# Patient Record
Sex: Female | Born: 1965 | Race: White | Hispanic: No | Marital: Married | Smoking: Never smoker
Health system: Southern US, Community
[De-identification: ages and names within clinical notes are randomized; demographics above are authoritative.]

## PROBLEM LIST (undated history)

## (undated) DIAGNOSIS — F32A Depression, unspecified: Secondary | ICD-10-CM

## (undated) DIAGNOSIS — E079 Disorder of thyroid, unspecified: Secondary | ICD-10-CM

## (undated) DIAGNOSIS — F329 Major depressive disorder, single episode, unspecified: Secondary | ICD-10-CM

## (undated) HISTORY — PX: CERVICAL SPINE SURGERY: SHX589

## (undated) HISTORY — PX: ABDOMINAL HYSTERECTOMY: SHX81

---

## 1999-09-27 ENCOUNTER — Other Ambulatory Visit: Admission: RE | Admit: 1999-09-27 | Discharge: 1999-09-27 | Payer: Self-pay | Admitting: Family Medicine

## 2000-01-24 ENCOUNTER — Encounter (HOSPITAL_COMMUNITY): Admission: AD | Admit: 2000-01-24 | Discharge: 2000-04-23 | Payer: Self-pay | Admitting: Obstetrics and Gynecology

## 2000-03-02 ENCOUNTER — Inpatient Hospital Stay (HOSPITAL_COMMUNITY): Admission: RE | Admit: 2000-03-02 | Discharge: 2000-03-03 | Payer: Self-pay | Admitting: Obstetrics and Gynecology

## 2000-03-02 ENCOUNTER — Encounter (INDEPENDENT_AMBULATORY_CARE_PROVIDER_SITE_OTHER): Payer: Self-pay

## 2004-11-12 ENCOUNTER — Encounter: Admission: RE | Admit: 2004-11-12 | Discharge: 2004-11-12 | Payer: Self-pay | Admitting: Family Medicine

## 2006-12-11 ENCOUNTER — Ambulatory Visit (HOSPITAL_COMMUNITY): Admission: RE | Admit: 2006-12-11 | Discharge: 2006-12-11 | Payer: Self-pay | Admitting: Family Medicine

## 2007-01-07 ENCOUNTER — Ambulatory Visit (HOSPITAL_COMMUNITY): Admission: RE | Admit: 2007-01-07 | Discharge: 2007-01-08 | Payer: Self-pay | Admitting: Neurosurgery

## 2007-02-25 ENCOUNTER — Ambulatory Visit (HOSPITAL_COMMUNITY): Admission: RE | Admit: 2007-02-25 | Discharge: 2007-02-25 | Payer: Self-pay | Admitting: Family Medicine

## 2007-03-05 ENCOUNTER — Ambulatory Visit (HOSPITAL_COMMUNITY): Admission: RE | Admit: 2007-03-05 | Discharge: 2007-03-05 | Payer: Self-pay | Admitting: Family Medicine

## 2007-03-22 ENCOUNTER — Ambulatory Visit (HOSPITAL_COMMUNITY): Admission: RE | Admit: 2007-03-22 | Discharge: 2007-03-22 | Payer: Self-pay | Admitting: Family Medicine

## 2007-08-09 ENCOUNTER — Ambulatory Visit (HOSPITAL_COMMUNITY): Admission: RE | Admit: 2007-08-09 | Discharge: 2007-08-09 | Payer: Self-pay | Admitting: Family Medicine

## 2008-01-01 IMAGING — CR DG CERVICAL SPINE 2 OR 3 VIEWS
1 series · 1 of 1 positions shown · non-contrast
Comparison: none

CLINICAL DATA: Cervical disk herniation.
 CERVICAL SPINE ? 3 VIEW:

[view not recorded]
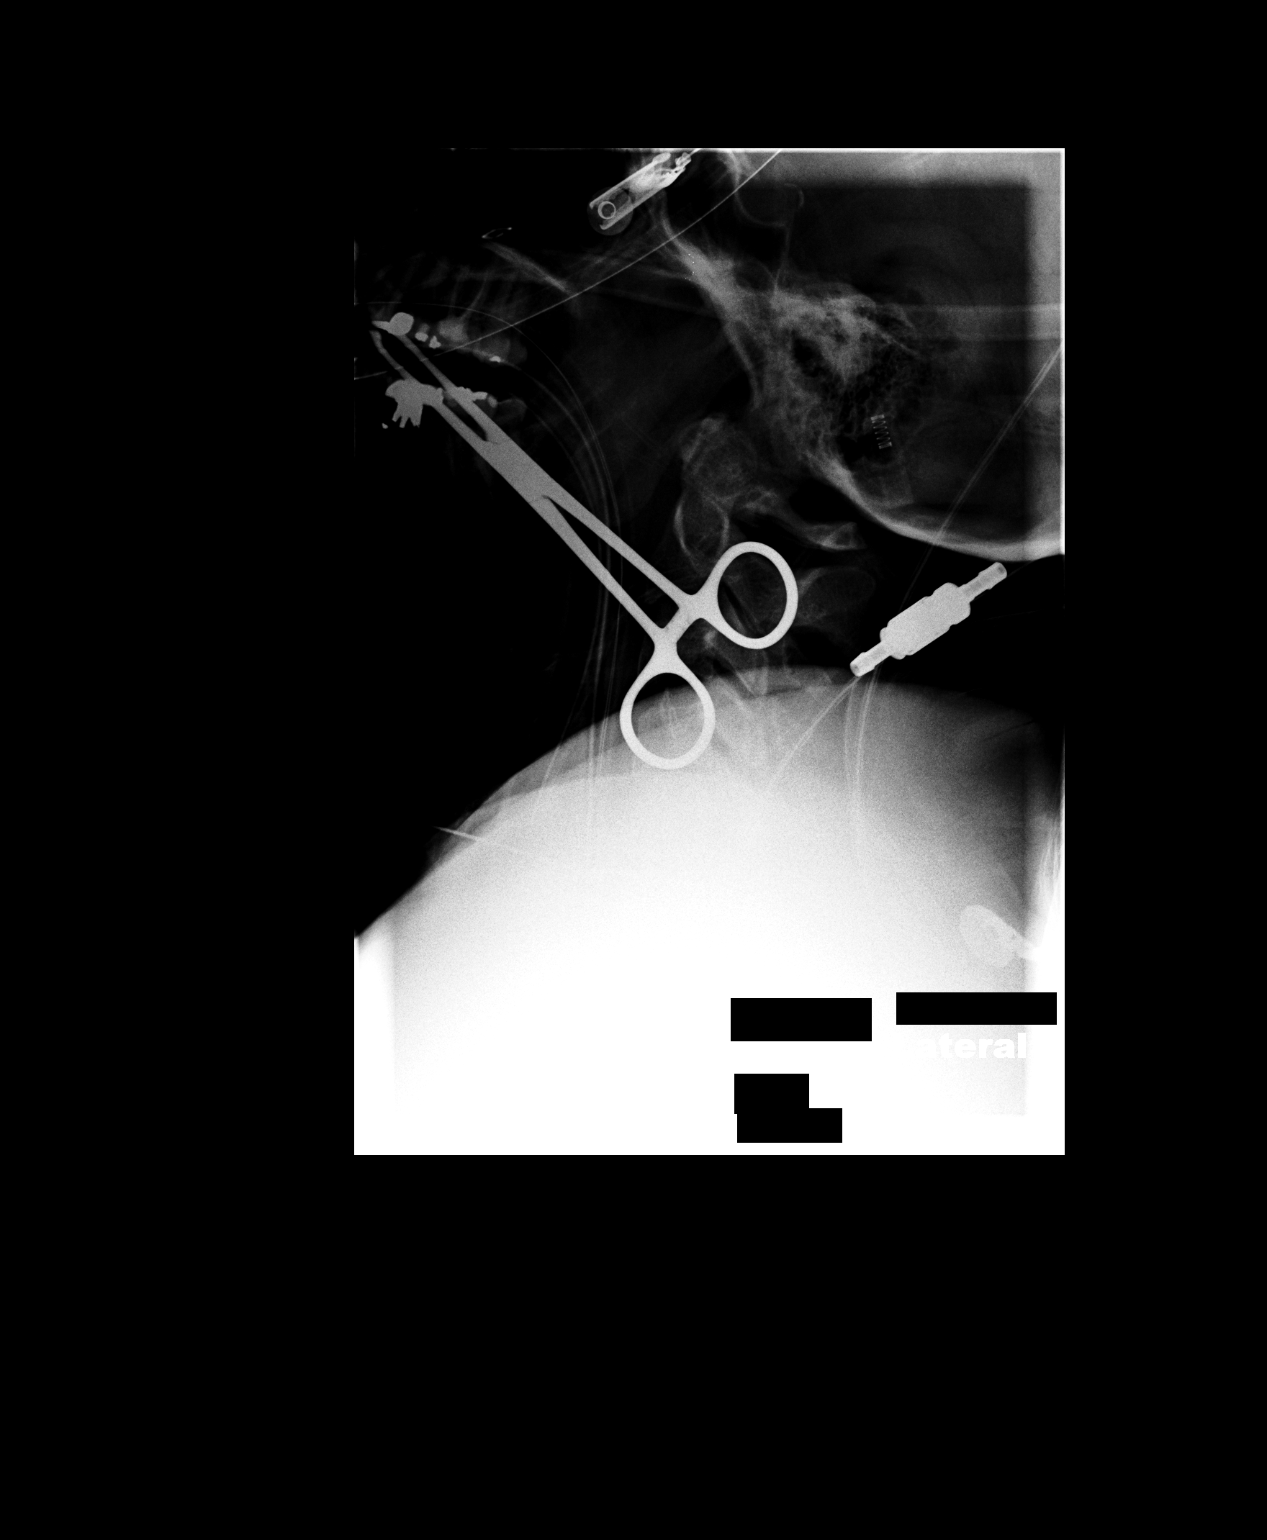

[1 of 1 positions shown; findings below may reference images not displayed]

FINDINGS: There are spinal needles marking the C4-5 and C5-6 disk space levels. There is a third film demonstrating anterior plate and screws fusing C5-6.
IMPRESSION: C5-6 fusion.

## 2008-11-29 ENCOUNTER — Other Ambulatory Visit: Admission: RE | Admit: 2008-11-29 | Discharge: 2008-11-29 | Payer: Self-pay | Admitting: Family Medicine

## 2008-11-29 ENCOUNTER — Encounter (INDEPENDENT_AMBULATORY_CARE_PROVIDER_SITE_OTHER): Payer: Self-pay | Admitting: Family Medicine

## 2011-01-24 NOTE — Discharge Summary (Signed)
[facility]  Patient:    Stefanie Carlson, Stefanie Carlson                          MRN: [id number] Adm. Date:  [id number] Disc. Date: [id number] Attending:  Silverio Lay A                           Discharge Summary  DISCHARGE DIAGNOSES: 1. Menorrhagia. 2. Secondary anemia. 3. Cystocele. 4. Abnormal Pap smear. 5. Enterocele.  HOSPITAL COURSE:  The patient underwent uncomplicated LAVH, McCullough culdoplasty and anterior colporrhaphy on March 02, 2000. Postoperative course was uncomplicated.  Hemoglobin 9.1.  Discharged to home on day #1.  Tylox given for pain.  DISCHARGE INSTRUCTIONS:  Discharge teaching done.  Follow up in the office in four to six weeks. DD:  05/14/00 TD:  05/16/00 Job: [id number]

## 2011-01-24 NOTE — H&P (Signed)
[facility] of [facility]  Patient:    Stefanie Carlson, Stefanie Carlson                          MRN: [id number] Adm. Date:  03/01/00 Attending:  Lenoard Aden, M.D. [facility]                         History and Physical  CHIEF COMPLAINT:              Carcinoma in situ of the cervix and menorrhagia.  HISTORY OF PRESENT ILLNESS:   The patient is a 45 year old white female, G2, P2, status post tubal ligation, who presents with cervical carcinoma in situ and dysfunctional uterine bleeding with menorrhagia, dysmenorrhea, and refractory anemia for definitive therapy.  GYNECOLOGIC HISTORY:          Remarkable for abnormal Pap smear and colposcopically-directed biopsy revealing aforementioned carcinoma in situ. No evidence of invasion.  She is posttubal ligation.  She has had two uncomplicated vaginal deliveries.  PAST HOSPITALIZATIONS:        Hospitalizations for mononucleosis, dehydration, and tubal ligation 1989.  FAMILY HISTORY:               Family history of diabetes and heart disease.  MEDICAL HISTORY:              Medical problems also include history of hypothyroidism.  MEDICATIONS:                  1. Synthroid.                               2. Hemocyte which is an iron supplement.  ALLERGIES:                    She has no known drug allergies.  SOCIAL HISTORY:               Noncontributory.  PHYSICAL EXAMINATION  GENERAL APPEARANCE:           She is  well-developed, well-nourished, white female in no apparent distress.  VITAL SIGNS:                  Blood pressure 118/68.  Weight of 194 pounds. Height of 5 feet, 3 inches.  HEENT:                        Normal.  LUNGS:                        Clear.  CARDIAC:                      Heart regular rate and rhythm.  ABDOMEN:                      Soft, scaphoid, and nontender.  PELVIC:                       Exam reveals a grade 2 cystocele, a retroflexed and enlarged uterus and no adnexal  masses.  IMPRESSION:                   1. Pelvic relaxation with cystocele.  2. Cervical carcinoma in situ, status post tubal                                  ligation, there for definitive therapy.                               3. Secondary anemia with previous hemoglobin of                                  6.5 now elevated to greater than 10-10.7                                  after two months of Lupron.  PLAN:                         Proceed with laparoscopically-assisted vaginal hysterectomy, anterior colporrhaphy, possible total abdominal hysterectomy. Risks from anesthesia, infection, bleeding, injury to abdominal organs, and need for repair are discussed.  Possible injury to bowel and bladder with need for repair are noted.  Delayed versus immediate complications including bowel or bladder injury are noted.  Inability to completely cure cancer is discussed with the patient as well.  She acknowledges this risks and desires to proceed. DD:  03/01/00 TD:  03/02/00 Job: [id number]

## 2011-01-24 NOTE — Op Note (Signed)
[facility] of [facility]  Patient:    Stefanie Carlson, Stefanie Carlson                          MRN: [id number] Proc. Date: 03/02/00 Adm. Date:  [id number] Attending:  Lenoard Aden                           Operative Report  PREOPERATIVE DIAGNOSES:       1. Menorrhagia with secondary anemia.                               2. Cystocele.                               3. Cervical carcinoma in situ.  POSTOPERATIVE DIAGNOSES:      1. Menorrhagia with secondary anemia.                               2. Cystocele.                               3. Cervical carcinoma in situ.  PROCEDURE:                    Laparoscopically assisted vaginal hysterectomy and anterior colporrhaphy.  McCall culdoplasty.  SURGEON:                      Lenoard Aden, M.D.  ASSISTANT:                    Sung Amabile. Roslyn Smiling, M.D.  ANESTHESIA:                   General.  ESTIMATED BLOOD LOSS:         200 cc.  DRAINS:                       Foley and vaginal pack.  COUNTS:                       Correct.  DISPOSITION:                  To recovery in good condition.  BRIEF OPERATIVE NOTE:         After being apprised of the risks of anesthesia, infection, bleeding, injury to abdominal organs and the need for repair, delayed versus immediate complications to include bowel and bladder injury and the need for repair, the patient was brought to the operating room, where she was prepped and draped in the usual sterile fashion.  She was placed under general anesthetic, a Foley catheter was placed and her feet were placed in the [place] stirrups.  After achieving adequate anesthesia and prepping the patient, examination under anesthesia revealed a grade 2 cystocele, a retroflexed boggy uterus, no adnexal masses.  Rectal exam was negative.  After this, attention was turned to the abdominal portion of the procedure, where an infraumbilical incision was made with the scalpel and the Veress needle was placed.  Then, 3 L  of CO2 were insufflated without difficulty after an openiNG pressure of -2 was noted.  Visualization revealed atraumatic  trocar entry, normal appendiceal area, and a normal liver/gallbladder area.  Two 5 mm lower quadrant ports were placed in the left and right lower quadrants atraumatically.  At this time, visualization revealed a large, retroflexed uterus extending into the cul-de-sac.  The cul-de-sac was otherwise clear. Two normal ovaries were noted.  Two interrupted tubes with Falope rings were noted at this time.  Tripolar was entered through the left port and the tuboovarian round ligament complex was cauterized down to resistance of 0 and cut.  Progressive bights were taken down to the level of the uterine vessels after identifying the ureter on the left side.  The same procedure was done on the right.  Both ovaries and tubes appeared normal.  The tubes were both surgically interrupted.  A bladder flap was developed using sharp dissection with the curved scissors.  Good hemostasis was achieved.  Attention was turned to the vaginal portion of the procedure, whereby a weighted speculum was placed and the cervical vaginal mucosa was infiltrated using a dilute Xylocaine epinephrine solution and scored using the scalpel.  Posterior cul-de-sac entry was made atraumatically.  A long weighted speculum was placed.  The uterosacral ligaments were bilaterally grasped and suture ligated.  Using curved Heaney clamps transfixed through the vaginal cuff, anterior cul-de-sac entry was made atraumatically.  A long Beaver was placed. The uterine vessels were secured bilaterally, clamped and suture ligated using Heaney clamps.  The laparoscopic portion of the procedure using cautery was connected to the vaginal portion of the procedure using Masterson clamps, and these pedicles were suture ligated and then tied.  At this time, the specimen was removed.  The posterior vaginal vault was reefed using a 0  Vicryl continuous suture and McCall culdoplasty sutures x 2 were placed using 0 Vicryl sutures.  The vagina was closed using 0 Vicryl interrupted sutures in figure-of-eight fashion.  Anterior repair was then performed, dissecting the vaginal mucosa of the pubovesical and cervical fascia, and this was sharply done, reducing the bladder in the process.  Anterior repair was accomplished by placing mattress sutures of 2-0 Vicryl suture and the defect was then trimmed and closed using 0 Vicryl pop-offs.  Good hemostasis was achieved. Packing was placed.  The uterosacral ligaments were tied in the midline and plicated in the midline.  The laparoscopic portion of the procedure was then revisited, whereby all pedicles appeared dry.  Pictures were taken. Irrigation was accomplished.  Good hemostasis was noted.  All instruments were removed under direct visualization.  The incision was closed using 0 and 4-0 Vicryl.  Dilute Marcaine solution was placed.  The patient tolerated the procedure well and was transferred to recovery in good condition. DD:  03/02/00 TD:  03/03/00 Job: [id number]

## 2011-01-24 NOTE — Op Note (Signed)
Stefanie Carlson                 ACCOUNT NO.:  [id number]   MEDICAL RECORD NO.:  [id number]          PATIENT TYPE:  AMB   LOCATION:  SDS                          FACILITY:  [facility]   PHYSICIAN:  Danae Orleans. Venetia Maxon, M.D.  DATE OF BIRTH:  1966/05/30   DATE OF PROCEDURE:  01/07/2007  DATE OF DISCHARGE:                               OPERATIVE REPORT   PREOPERATIVE DIAGNOSIS:  Herniated cervical disk with cervical  spondylosis, degenerative disease and radiculopathy, C5-6 level.   POSTOPERATIVE DIAGNOSIS:  Herniated cervical disk with cervical  spondylosis, degenerative disease and radiculopathy, C5-6 level.   PROCEDURE:  The anterior cervical decompression and fusion C5-6 with  PEEK interbody cage with morcellized bone autograft and anterior  cervical plate.   SURGEON:  Danae Orleans. Venetia Maxon, M.D.   ASSISTANT:  Georgiann Cocker, RN and Coletta Memos, M.D.   ANESTHESIA:  General endotracheal anesthesia.   ESTIMATED BLOOD LOSS:  Minimal.   COMPLICATIONS:  None.   DISPOSITION:  Recovery.   INDICATIONS:  Stefanie Carlson is a 45 year old woman with C6 radiculopathy  on the left and large cervical disk herniation at C5-6 with cervical  spondylosis at this level.  It was elected to take her to surgery for  anterior cervical decompression and fusion at this affected level.   PROCEDURE:  Stefanie Carlson was brought to the operating room.  Following  satisfactory and uncomplicated induction of general endotracheal  anesthesia, placement intravenous lines, the patient was placed in  supine position on the operating table.  Her neck was placed in slight  extension.  She was placed in 10 pounds of Network engineer.  Her anterior  neck was then prepped and draped in the usual sterile fashion.  The area  of planned incision was infiltrated with 0.25% Marcaine and 0.5%  lidocaine with 1:200,000 epinephrine.  The incision was marked at the  level of the carotid tubercle on the left side of midline from midline  to  the anterior border sternocleidomastoid muscle.  This was carried  sharply through a significant amount of subcutaneous fat through this  through the platysma layer.  Subplatysmal dissection was performed  exposing the anterior border sternocleidomastoid muscle.  Using blunt  dissection the carotid sheath was kept lateral, trachea and esophagus  kept medial exposing the anterior cervical spine.  A bent spinal needle  was placed in what was felt to the C5-6 level but because of the  patient's large body habitus it was difficult for this to be visualized  by intraoperative x-ray and subsequently additional marking needle was  placed one level up at what was felt to be C4-5 level.  This was  subsequently confirmed on intraoperative x-ray.  The longus colli  muscles were then taken down from the anterior cervical spine from C5-C6  bilaterally using electrocautery and Key elevator and self-retaining  shadow line retractors placed to facilitate exposure.  The interspace  was incised with 15 blade and disk material was removed in piecemeal  fashion.  Ventral bone spurs were removed and kept for use in later bone  grafting.  The interspace was  evacuated disk material and endplates were  stripped of residual disk material.  There was a large amount of disk  herniation on the left side of midline overlying the left C6 nerve root  and this was decompressed.  Using [facility] microscopic visualization the  endplates were stripped of residual disk material and endplates were  drilled with high-speed drill and the bone removed was saved for later  use in bone grafting.  The central spinal cord dura and both C6 nerve  roots widely decompressed.  Hemostasis was assured and after trial  sizing a 7-mm medium PEEK interbody cage was selected, packed with  morcellized bone autograft, inserted in the interspace, countersunk  appropriately.  A 14-mm __________ anterior cervical plate was then  affixed to the anterior  cervical spine using 14-mm variable angle  screws, two at C5, two in C6.  All screws had excellent purchase.  Locking mechanism engaged.  Spinal x-ray demonstrated well-positioned  interbody graft and anterior cervical plate at the correct level.  Hemostasis assured.  Soft tissues inspected and found to be in good  repair. Platysma layer was closed with 3-0 Vicryl sutures and skin edges  were approximated interrupted 3-0 Vicryl subcuticular stitch.  The wound  was dressed with Dermabond.  The patient was extubated in the operating  room and taken to the recovery room stable satisfactory condition having  the operation well.  Counts correct end of the case.      Danae Orleans. Venetia Maxon, M.D.  Electronically Signed     JDS/MEDQ  D:  01/07/2007  T:  01/07/2007  Job:  [id number]

## 2011-10-03 ENCOUNTER — Encounter (HOSPITAL_COMMUNITY): Payer: Self-pay

## 2011-10-03 ENCOUNTER — Emergency Department (HOSPITAL_COMMUNITY)
Admission: EM | Admit: 2011-10-03 | Discharge: 2011-10-03 | Disposition: A | Discharge status: Home / Self Care | Payer: 59 | Attending: Emergency Medicine | Admitting: Emergency Medicine

## 2011-10-03 DIAGNOSIS — F329 Major depressive disorder, single episode, unspecified: Secondary | ICD-10-CM

## 2011-10-03 DIAGNOSIS — F3289 Other specified depressive episodes: Secondary | ICD-10-CM | POA: Insufficient documentation

## 2011-10-03 DIAGNOSIS — F419 Anxiety disorder, unspecified: Secondary | ICD-10-CM

## 2011-10-03 DIAGNOSIS — F32A Depression, unspecified: Secondary | ICD-10-CM

## 2011-10-03 DIAGNOSIS — F411 Generalized anxiety disorder: Secondary | ICD-10-CM | POA: Insufficient documentation

## 2011-10-03 DIAGNOSIS — E079 Disorder of thyroid, unspecified: Secondary | ICD-10-CM | POA: Insufficient documentation

## 2011-10-03 DIAGNOSIS — Z79899 Other long term (current) drug therapy: Secondary | ICD-10-CM | POA: Insufficient documentation

## 2011-10-03 HISTORY — DX: Depression, unspecified: F32.A

## 2011-10-03 HISTORY — DX: Major depressive disorder, single episode, unspecified: F32.9

## 2011-10-03 HISTORY — DX: Disorder of thyroid, unspecified: E07.9

## 2011-10-03 MED ORDER — ESCITALOPRAM OXALATE 20 MG PO TABS: 20.0000 mg | ORAL_TABLET | Freq: Every day | ORAL | Status: DC
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
## 2011-10-03 NOTE — ED Notes (Signed)
Family at bedside. 
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
## 2011-10-03 NOTE — ED Notes (Signed)
Patient c/o anxiety and is very tearful, states she is out of her Lexapro for several days and is starting to feel the effects. Patient denies suicidal or homicidal ideation.

## 2015-01-29 ENCOUNTER — Telehealth (HOSPITAL_COMMUNITY): Payer: Self-pay | Admitting: *Deleted

## 2015-01-29 NOTE — Telephone Encounter (Signed)
Opened in Error.

## 2016-03-13 ENCOUNTER — Other Ambulatory Visit: Payer: Self-pay | Admitting: Endocrinology

## 2016-03-13 DIAGNOSIS — E010 Iodine-deficiency related diffuse (endemic) goiter: Secondary | ICD-10-CM

## 2016-06-04 ENCOUNTER — Other Ambulatory Visit: Payer: Self-pay

## 2016-06-11 ENCOUNTER — Other Ambulatory Visit: Payer: Self-pay

## 2016-06-20 ENCOUNTER — Other Ambulatory Visit: Payer: Self-pay

## 2017-04-23 DIAGNOSIS — E039 Hypothyroidism, unspecified: Secondary | ICD-10-CM | POA: Diagnosis not present

## 2017-04-23 DIAGNOSIS — E7800 Pure hypercholesterolemia, unspecified: Secondary | ICD-10-CM | POA: Diagnosis not present

## 2017-04-23 DIAGNOSIS — E1165 Type 2 diabetes mellitus with hyperglycemia: Secondary | ICD-10-CM | POA: Diagnosis not present

## 2017-04-30 DIAGNOSIS — E1165 Type 2 diabetes mellitus with hyperglycemia: Secondary | ICD-10-CM | POA: Diagnosis not present

## 2017-04-30 DIAGNOSIS — I10 Essential (primary) hypertension: Secondary | ICD-10-CM | POA: Diagnosis not present

## 2017-04-30 DIAGNOSIS — E7800 Pure hypercholesterolemia, unspecified: Secondary | ICD-10-CM | POA: Diagnosis not present

## 2017-05-01 ENCOUNTER — Other Ambulatory Visit: Payer: Self-pay | Admitting: Endocrinology

## 2017-05-01 DIAGNOSIS — E049 Nontoxic goiter, unspecified: Secondary | ICD-10-CM

## 2017-07-09 DIAGNOSIS — Z1231 Encounter for screening mammogram for malignant neoplasm of breast: Secondary | ICD-10-CM | POA: Diagnosis not present

## 2017-09-17 DIAGNOSIS — E7800 Pure hypercholesterolemia, unspecified: Secondary | ICD-10-CM | POA: Diagnosis not present

## 2017-09-17 DIAGNOSIS — E1165 Type 2 diabetes mellitus with hyperglycemia: Secondary | ICD-10-CM | POA: Diagnosis not present

## 2017-09-17 DIAGNOSIS — E039 Hypothyroidism, unspecified: Secondary | ICD-10-CM | POA: Diagnosis not present

## 2017-10-05 DIAGNOSIS — E1165 Type 2 diabetes mellitus with hyperglycemia: Secondary | ICD-10-CM | POA: Diagnosis not present

## 2017-10-05 DIAGNOSIS — E7800 Pure hypercholesterolemia, unspecified: Secondary | ICD-10-CM | POA: Diagnosis not present

## 2017-10-05 DIAGNOSIS — I10 Essential (primary) hypertension: Secondary | ICD-10-CM | POA: Diagnosis not present

## 2018-01-19 DIAGNOSIS — I10 Essential (primary) hypertension: Secondary | ICD-10-CM | POA: Diagnosis not present

## 2018-01-21 DIAGNOSIS — E118 Type 2 diabetes mellitus with unspecified complications: Secondary | ICD-10-CM | POA: Diagnosis not present

## 2018-02-04 DIAGNOSIS — E1165 Type 2 diabetes mellitus with hyperglycemia: Secondary | ICD-10-CM | POA: Diagnosis not present

## 2018-02-04 DIAGNOSIS — E039 Hypothyroidism, unspecified: Secondary | ICD-10-CM | POA: Diagnosis not present

## 2018-02-04 DIAGNOSIS — E7800 Pure hypercholesterolemia, unspecified: Secondary | ICD-10-CM | POA: Diagnosis not present

## 2018-02-09 ENCOUNTER — Encounter: Payer: Self-pay | Admitting: Gastroenterology

## 2018-03-08 ENCOUNTER — Ambulatory Visit (INDEPENDENT_AMBULATORY_CARE_PROVIDER_SITE_OTHER): Payer: Self-pay

## 2018-03-08 DIAGNOSIS — Z1211 Encounter for screening for malignant neoplasm of colon: Secondary | ICD-10-CM

## 2018-03-08 MED ORDER — NA SULFATE-K SULFATE-MG SULF 17.5-3.13-1.6 GM/177ML PO SOLN: 1.0000 | ORAL | 0 refills | Status: DC

## 2018-03-08 NOTE — Progress Notes (Signed)
Gastroenterology Pre-Procedure Review  Request Date:03/08/18 Requesting Physician: Rowan Blase PA Larene Pickett ( no previous tcs)  PATIENT REVIEW QUESTIONS: The patient responded to the following health history questions as indicated:    1. Diabetes Melitis: no 2. Joint replacements in the past 12 months: no 3. Major health problems in the past 3 months: no 4. Has an artificial valve or MVP: no 5. Has a defibrillator: no 6. Has been advised in past to take antibiotics in advance of a procedure like teeth cleaning: no 7. Family history of colon cancer: no  8. Alcohol Use: no 9. History of sleep apnea: no  10. History of coronary artery or other vascular stents placed within the last 12 months: no 11. History of any prior anesthesia complications: no    MEDICATIONS & ALLERGIES:    Patient reports the following regarding taking any blood thinners:   Plavix? no Aspirin? no Coumadin? no Brilinta? no Xarelto? no Eliquis? no Pradaxa? no Savaysa? no Effient? no  Patient confirms/reports the following medications:  Current Outpatient Medications  Medication Sig Dispense Refill  . escitalopram (LEXAPRO) 20 MG tablet Take 20 mg by mouth daily.    Marland Kitchen levothyroxine (SYNTHROID, LEVOTHROID) 112 MCG tablet Take 112 mcg by mouth daily.    Marland Kitchen lisinopril-hydrochlorothiazide (PRINZIDE,ZESTORETIC) 20-25 MG tablet daily.    . rosuvastatin (CRESTOR) 10 MG tablet daily.    Marland Kitchen escitalopram (LEXAPRO) 20 MG tablet Take 1 tablet (20 mg total) by mouth daily. 10 tablet 0   No current facility-administered medications for this visit.     Patient confirms/reports the following allergies:  No Known Allergies  No orders of the defined types were placed in this encounter.   AUTHORIZATION INFORMATION Primary Insurance: [facility],  [place] #: [id number] Pre-Cert / Josem Kaufmann required: yes Pre-Cert / Auth #: [id number]   SCHEDULE INFORMATION: Procedure has been scheduled as follows:  Date:04/29/18 , Time:12:15  Location: [facility]  Dr.Fields  This Gastroenterology Pre-Precedure Review Form is being routed to the following provider(s): LSL

## 2018-03-08 NOTE — Patient Instructions (Addendum)
Stefanie Carlson  01-15-1966 MRN: [id number]     Procedure Date: 04/09/18 Time to register: 11:15am Place to register: [facility] Procedure Time: 12:15am Scheduled provider: Barney Drain, MD    PREPARATION FOR COLONOSCOPY WITH SUPREP BOWEL PREP KIT  Note: Suprep Bowel Prep Kit is a split-dose (2day) regimen. Consumption of BOTH 6-ounce bottles is required for a complete prep.  Please notify us immediately if you are diabetic, take iron supplements, or if you are on Coumadin or any other blood thinners.                                                                                                                                                   1 DAY BEFORE PROCEDURE:  DATE: 04/08/18  DAY: Thursday  clear liquids the entire day - NO SOLID FOOD.     At 6:00pm: Complete steps 1 through 4 below, using ONE (1) 6-ounce bottle, before going to bed. Step 1:  Pour ONE (1) 6-ounce bottle of SUPREP liquid into the mixing container.  Step 2:  Add cool drinking water to the 16 ounce line on the container and mix.  Note: Dilute the solution concentrate as directed prior to use. Step 3:  DRINK ALL the liquid in the container. Step 4:  You MUST drink an additional two (2) or more 16 ounce containers of water over the next one (1) hour.   Continue clear liquids only EXCEPTION: If you take medications for your heart, blood pressure, or breathing, you may take these medications with a small amount of clear liquid.   DAY OF PROCEDURE:   DATE: 04/28/18 DAY: Friday    5 hours before your procedure at :7:15am Step 1:  Pour ONE (1) 6-ounce bottle of SUPREP liquid into the mixing container.  Step 2:  Add cool drinking water to the 16 ounce line on the container and mix.  Note: Dilute the solution concentrate as directed prior to use. Step 3:  DRINK ALL the liquid in the container. Step 4:  You MUST drink an additional two (2) or more 16 ounce containers of water over the next one (1) hour.  You MUST complete the final glass of water at least 3 hours before your colonoscopy.   Nothing by mouth past 9:15am  You may take your morning medications with sip of water unless we have instructed otherwise.    Please see below for Dietary Information.  CLEAR LIQUIDS INCLUDE:  Water Jello (NOT red in color)   Ice Popsicles (NOT red in color)   Tea (sugar ok, no milk/cream) Powdered fruit flavored drinks  Coffee (sugar ok, no milk/cream) Gatorade/ Lemonade/ Kool-Aid  (NOT red in color)   Juice: apple, white grape, white cranberry Soft drinks  Clear bullion, consomme, broth (fat free beef/chicken/vegetable)  Carbonated beverages (any kind)  Strained chicken noodle  soup Hard Candy   Remember: Clear liquids are liquids that will allow you to see your fingers on the other side of a clear glass. Be sure liquids are NOT red in color, and not cloudy, but CLEAR.  DO NOT EAT OR DRINK ANY OF THE FOLLOWING:  Dairy products of any kind   Cranberry juice Tomato juice / V8 juice   Grapefruit juice Orange juice     Red grape juice  Do not eat any solid foods, including such foods as: cereal, oatmeal, yogurt, fruits, vegetables, creamed soups, eggs, bread, crackers, pureed foods in a blender, etc.   HELPFUL HINTS FOR DRINKING PREP SOLUTION:   Make sure prep is extremely cold. Mix and refrigerate the the morning of the prep. You may also put in the freezer.   You may try mixing some Crystal Light or Country Time Lemonade if you prefer. Mix in small amounts; add more if necessary.  Try drinking through a straw  Rinse mouth with water or a mouthwash between glasses, to remove after-taste.  Try sipping on a cold beverage /ice/ popsicles between glasses of prep.  Place a piece of sugar-free hard candy in mouth between glasses.  If you become nauseated, try consuming smaller amounts, or stretch out the time between glasses. Stop for 30-60 minutes, then slowly start back drinking.     OTHER  INSTRUCTIONS  You will need a responsible adult at least 54 years of age to accompany you and drive you home. This person must remain in the waiting room during your procedure. The hospital will cancel your procedure if you do not have a responsible adult with you.   1. Wear loose fitting clothing that is easily removed. 2. Leave jewelry and other valuables at home.  3. Remove all body piercing jewelry and leave at home. 4. Total time from sign-in until discharge is approximately 2-3 hours. 5. You should go home directly after your procedure and rest. You can resume normal activities the day after your procedure. 6. The day of your procedure you should not:  Drive  Make legal decisions  Operate machinery  Drink alcohol  Return to work   You may call the office (Dept: [phone]) before 5:00pm, or page the doctor on call [phone]) after 5:00pm, for further instructions, if necessary.   Insurance Information YOU WILL NEED TO CHECK WITH YOUR INSURANCE COMPANY FOR THE BENEFITS OF COVERAGE YOU HAVE FOR THIS PROCEDURE.  UNFORTUNATELY, NOT ALL INSURANCE COMPANIES HAVE BENEFITS TO COVER ALL OR PART OF THESE TYPES OF PROCEDURES.  IT IS YOUR RESPONSIBILITY TO CHECK YOUR BENEFITS, HOWEVER, WE WILL BE GLAD TO ASSIST YOU WITH ANY CODES YOUR INSURANCE COMPANY MAY NEED.    PLEASE NOTE THAT MOST INSURANCE COMPANIES WILL NOT COVER A SCREENING COLONOSCOPY FOR PEOPLE UNDER THE AGE OF 50  IF YOU HAVE [employer] INSURANCE, YOU MAY HAVE BENEFITS FOR A SCREENING COLONOSCOPY BUT IF POLYPS ARE FOUND THE DIAGNOSIS WILL CHANGE AND THEN YOU MAY HAVE A DEDUCTIBLE THAT WILL NEED TO BE MET. SO PLEASE MAKE SURE YOU CHECK YOUR BENEFITS FOR A SCREENING COLONOSCOPY AS WELL AS A DIAGNOSTIC COLONOSCOPY.

## 2018-03-09 ENCOUNTER — Telehealth: Payer: Self-pay | Admitting: Gastroenterology

## 2018-03-09 NOTE — Telephone Encounter (Signed)
I checked the schedule and I did put the wrong day of the patients instructions and in the orders. I called and apologized to the pt. I have corrected it on her instructions and mailed it to her and I called Hoyle Sauer and the date has been changed on her orders.

## 2018-03-09 NOTE — Telephone Encounter (Signed)
Pt said she was seen by JL yesterday and said she wanted her TCS for 8/2 not 8/22. I told her it was scheduled for 8/22, but she wants to change it. Please call her at [phone]

## 2018-03-10 NOTE — Progress Notes (Signed)
Ok to schedule.

## 2018-03-19 NOTE — Telephone Encounter (Signed)
Called [employer] and changed date on PA for tcs. Ref# [id number].

## 2018-04-08 DIAGNOSIS — I10 Essential (primary) hypertension: Secondary | ICD-10-CM | POA: Diagnosis not present

## 2018-04-08 DIAGNOSIS — E1165 Type 2 diabetes mellitus with hyperglycemia: Secondary | ICD-10-CM | POA: Diagnosis not present

## 2018-04-08 DIAGNOSIS — E7800 Pure hypercholesterolemia, unspecified: Secondary | ICD-10-CM | POA: Diagnosis not present

## 2018-04-09 ENCOUNTER — Ambulatory Visit (HOSPITAL_COMMUNITY)
Admission: RE | Admit: 2018-04-09 | Discharge: 2018-04-09 | Disposition: A | Discharge status: Home / Self Care | Payer: 59 | Source: Ambulatory Visit | Attending: Gastroenterology | Admitting: Gastroenterology

## 2018-04-09 ENCOUNTER — Encounter (HOSPITAL_COMMUNITY)
Admission: RE | Disposition: A | Discharge status: Home / Self Care | Payer: Self-pay | Source: Ambulatory Visit | Attending: Gastroenterology

## 2018-04-09 ENCOUNTER — Other Ambulatory Visit: Payer: Self-pay

## 2018-04-09 ENCOUNTER — Encounter (HOSPITAL_COMMUNITY): Payer: Self-pay

## 2018-04-09 DIAGNOSIS — K621 Rectal polyp: Secondary | ICD-10-CM | POA: Insufficient documentation

## 2018-04-09 DIAGNOSIS — D124 Benign neoplasm of descending colon: Secondary | ICD-10-CM | POA: Diagnosis not present

## 2018-04-09 DIAGNOSIS — Q438 Other specified congenital malformations of intestine: Secondary | ICD-10-CM | POA: Insufficient documentation

## 2018-04-09 DIAGNOSIS — Z800 Family history of malignant neoplasm of digestive organs: Secondary | ICD-10-CM | POA: Diagnosis not present

## 2018-04-09 DIAGNOSIS — K648 Other hemorrhoids: Secondary | ICD-10-CM | POA: Diagnosis not present

## 2018-04-09 DIAGNOSIS — Z8371 Family history of colonic polyps: Secondary | ICD-10-CM | POA: Diagnosis not present

## 2018-04-09 DIAGNOSIS — F329 Major depressive disorder, single episode, unspecified: Secondary | ICD-10-CM | POA: Insufficient documentation

## 2018-04-09 DIAGNOSIS — K635 Polyp of colon: Secondary | ICD-10-CM | POA: Insufficient documentation

## 2018-04-09 DIAGNOSIS — E079 Disorder of thyroid, unspecified: Secondary | ICD-10-CM | POA: Diagnosis not present

## 2018-04-09 DIAGNOSIS — Z79899 Other long term (current) drug therapy: Secondary | ICD-10-CM | POA: Diagnosis not present

## 2018-04-09 DIAGNOSIS — Z1211 Encounter for screening for malignant neoplasm of colon: Secondary | ICD-10-CM | POA: Diagnosis not present

## 2018-04-09 HISTORY — PX: POLYPECTOMY: SHX5525

## 2018-04-09 HISTORY — PX: COLONOSCOPY: SHX5424

## 2018-04-09 LAB — GLUCOSE, CAPILLARY: GLUCOSE-CAPILLARY: 206 mg/dL — AB (ref 70–99)

## 2018-04-09 SURGERY — COLONOSCOPY
Anesthesia: Moderate Sedation

## 2018-04-09 MED ORDER — STERILE WATER FOR IRRIGATION IR SOLN
Status: DC | PRN
  Administered 2018-04-09: 1.5 mL

## 2018-04-09 MED ORDER — MIDAZOLAM HCL 5 MG/5ML IJ SOLN
INTRAMUSCULAR | Status: AC
  Filled 2018-04-09: qty 10

## 2018-04-09 MED ORDER — MEPERIDINE HCL 100 MG/ML IJ SOLN
INTRAMUSCULAR | Status: AC
  Filled 2018-04-09: qty 2

## 2018-04-09 MED ORDER — SODIUM CHLORIDE 0.9 % IV SOLN
INTRAVENOUS | Status: DC
  Administered 2018-04-09: 11:00:00 via INTRAVENOUS

## 2018-04-09 MED ORDER — MEPERIDINE HCL 100 MG/ML IJ SOLN
INTRAMUSCULAR | Status: DC | PRN
  Administered 2018-04-09 (×2): 25 mg via INTRAVENOUS

## 2018-04-09 MED ORDER — MIDAZOLAM HCL 5 MG/5ML IJ SOLN
INTRAMUSCULAR | Status: DC | PRN
  Administered 2018-04-09 (×2): 2 mg via INTRAVENOUS

## 2018-04-09 NOTE — Op Note (Signed)
[facility] Patient Name: Stefanie Carlson Procedure Date: 04/09/2018 10:33 AM MRN: [id number] Date of Birth: [birth year] Attending MD: Barney Drain MD, MD CSN: [id number] Age: 52 Admit Type: Outpatient Procedure:                Colonoscopy WITH COLD SNARE POLYPECTOMY Indications:              Screening for colorectal malignant neoplasm Providers:                Barney Drain MD, MD, Charlsie Quest. Theda Sers RN, RN,                            Aram Candela Referring MD:             Collene Mares PA, PA Medicines:                Meperidine 50 mg IV, Midazolam 4 mg IV Complications:            No immediate complications. Estimated Blood Loss:     Estimated blood loss was minimal. Procedure:                Pre-Anesthesia Assessment:                           - Prior to the procedure, a History and Physical                            was performed, and patient medications and                            allergies were reviewed. The patient's tolerance of                            previous anesthesia was also reviewed. The risks                            and benefits of the procedure and the sedation                            options and risks were discussed with the patient.                            All questions were answered, and informed consent                            was obtained. Prior Anticoagulants: The patient has                            taken no previous anticoagulant or antiplatelet                            agents. ASA Grade Assessment: II - A patient with                            mild systemic disease. After reviewing the risks  and benefits, the patient was deemed in                            satisfactory condition to undergo the procedure.                            After obtaining informed consent, the colonoscope                            was passed under direct vision. Throughout the                            procedure, the patient's blood  pressure, pulse, and                            oxygen saturations were monitored continuously. The                            CF-HQ190L ([id number]) scope was introduced through                            the anus and advanced to the the cecum, identified                            by appendiceal orifice and ileocecal valve. The                            colonoscopy was somewhat difficult due to a                            tortuous colon. Successful completion of the                            procedure was aided by straightening and shortening                            the scope to obtain bowel loop reduction and                            COLOWRAP. The patient tolerated the procedure well.                            The quality of the bowel preparation was excellent.                            The ileocecal valve, appendiceal orifice, and                            rectum were photographed. Scope In: 12:37:35 PM Scope Out: 12:51:42 PM Scope Withdrawal Time: 0 hours 12 minutes 12 seconds  Total Procedure Duration: 0 hours 14 minutes 7 seconds  Findings:      Two sessile polyps were found in the rectum and descending colon. The       polyps were  3 to 5 mm in size. These polyps were removed with a cold       snare. Resection and retrieval were complete.      The recto-sigmoid colon and sigmoid colon were mildly redundant.      Internal hemorrhoids were found. The hemorrhoids were small. Impression:               - Two 3 to 5 mm polyps in the rectum and in the                            descending colon, removed with a cold snare.                            Resected and retrieved.                           - Redundant LEFT colon.                           - Internal hemorrhoids. Moderate Sedation:      Moderate (conscious) sedation was administered by the endoscopy nurse       and supervised by the endoscopist. The following parameters were       monitored: oxygen saturation, heart  rate, blood pressure, and response       to care. Total physician intraservice time was 24 minutes. Recommendation:           - Patient has a contact number available for                            emergencies. The signs and symptoms of potential                            delayed complications were discussed with the                            patient. Return to normal activities tomorrow.                            Written discharge instructions were provided to the                            patient.                           - High fiber diet.                           - Continue present medications.                           - Await pathology results.                           - Repeat colonoscopy in 5-10 years for surveillance. Procedure Code(s):        --- Professional ---  45385, Colonoscopy, flexible; with removal of                            tumor(s), polyp(s), or other lesion(s) by snare                            technique                           G0500, Moderate sedation services provided by the                            same physician or other qualified health care                            professional performing a gastrointestinal                            endoscopic service that sedation supports,                            requiring the presence of an independent trained                            observer to assist in the monitoring of the                            patient's level of consciousness and physiological                            status; initial 15 minutes of intra-service time;                            patient age 66 years or older (additional time may                            be reported with [phone], as appropriate)                           [phone], Moderate sedation services provided by the                            same physician or other qualified health care                            professional performing the  diagnostic or                            therapeutic service that the sedation supports,                            requiring the presence of an independent trained                            observer to assist  in the monitoring of the                            patient's level of consciousness and physiological                            status; each additional 15 minutes intraservice                            time (List separately in addition to code for                            primary service) Diagnosis Code(s):        --- Professional ---                           Z12.11, Encounter for screening for malignant                            neoplasm of colon                           K62.1, Rectal polyp                           D12.4, Benign neoplasm of descending colon                           K64.8, Other hemorrhoids                           Q43.8, Other specified congenital malformations of                            intestine CPT copyright 2017 [employer]. All rights reserved. The codes documented in this report are preliminary and upon coder review may  be revised to meet current compliance requirements. Barney Drain, MD Barney Drain MD, MD 04/09/2018 1:01:44 PM This report has been signed electronically. Number of Addenda: 0

## 2018-04-09 NOTE — Discharge Instructions (Signed)
You had 2 small polypS removed. You have SMALL internal hemorrhoids.   DRINK WATER TO KEEP YOUR URINE LIGHT YELLOW.  CONTINUE YOUR WEIGHT LOSS EFFORTS. YOUR BODY MASS INDEX IS OVER 30 WHICH MEANS YOU ARE OBESE. OBESITY IS ASSOCIATED WITH AN INCREASED FOR CIRRHOSIS AND ALL CANCERS, INCLUDING ESOPHAGEAL AND COLON CANCER. A WEIGHT OF 167 LBS OR LESS  WILL GET YOUR BODY MASS INDEX(BMI) UNDER 30.  FOLLOW A HIGH FIBER DIET. AVOID ITEMS THAT CAUSE BLOATING & GAS. SEE INFO BELOW.   USE PREPARATION H FOUR TIMES  A DAY IF NEEDED TO RELIEVE RECTAL PAIN/PRESSURE/BLEEDING.   YOUR BIOPSY RESULTS WILL BE AVAILABLE IN 7 DAYS.   Next colonoscopy in 5-10 years.    Colonoscopy Care After Read the instructions outlined below and refer to this sheet in the next week. These discharge instructions provide you with general information on caring for yourself after you leave the hospital. While your treatment has been planned according to the most current medical practices available, unavoidable complications occasionally occur. If you have any problems or questions after discharge, call DR. Auston Halfmann, [phone].  ACTIVITY  You may resume your regular activity, but move at a slower pace for the next 24 hours.   Take frequent rest periods for the next 24 hours.   Walking will help get rid of the air and reduce the bloated feeling in your belly (abdomen).   No driving for 24 hours (because of the medicine (anesthesia) used during the test).   You may shower.   Do not sign any important legal documents or operate any machinery for 24 hours (because of the anesthesia used during the test).    NUTRITION  Drink plenty of fluids.   You may resume your normal diet as instructed by your doctor.   Begin with a light meal and progress to your normal diet. Heavy or fried foods are harder to digest and may make you feel sick to your stomach (nauseated).   Avoid alcoholic beverages for 24 hours or as  instructed.    MEDICATIONS  You may resume your normal medications.   WHAT YOU CAN EXPECT TODAY  Some feelings of bloating in the abdomen.   Passage of more gas than usual.   Spotting of blood in your stool or on the toilet paper  .  IF YOU HAD POLYPS REMOVED DURING THE COLONOSCOPY:  Eat a soft diet IF YOU HAVE NAUSEA, BLOATING, ABDOMINAL PAIN, OR VOMITING.    FINDING OUT THE RESULTS OF YOUR TEST Not all test results are available during your visit. DR. Oneida Alar WILL CALL YOU WITHIN 14 DAYS OF YOUR PROCEDUE WITH YOUR RESULTS. Do not assume everything is normal if you have not heard from DR. Kenetha Cozza, CALL HER OFFICE AT [phone].  SEEK IMMEDIATE MEDICAL ATTENTION AND CALL THE OFFICE: [phone] IF:  You have more than a spotting of blood in your stool.   Your belly is swollen (abdominal distention).   You are nauseated or vomiting.   You have a temperature over 101F.   You have abdominal pain or discomfort that is severe or gets worse throughout the day.   High-Fiber Diet A high-fiber diet changes your normal diet to include more whole grains, legumes, fruits, and vegetables. Changes in the diet involve replacing refined carbohydrates with unrefined foods. The calorie level of the diet is essentially unchanged. The Dietary Reference Intake (recommended amount) for adult males is 38 grams per day. For adult females, it is 25 grams per day. Pregnant  and lactating women should consume 28 grams of fiber per day. Fiber is the intact part of a plant that is not broken down during digestion. Functional fiber is fiber that has been isolated from the plant to provide a beneficial effect in the body. PURPOSE  Increase stool bulk.   Ease and regulate bowel movements.   Lower cholesterol.   REDUCE RISK OF COLON CANCER  INDICATIONS THAT YOU NEED MORE FIBER  Constipation and hemorrhoids.   Uncomplicated diverticulosis (intestine condition) and irritable bowel syndrome.    Weight management.   As a protective measure against hardening of the arteries (atherosclerosis), diabetes, and cancer.   GUIDELINES FOR INCREASING FIBER IN THE DIET  Start adding fiber to the diet slowly. A gradual increase of about 5 more grams (2 slices of whole-wheat bread, 2 servings of most fruits or vegetables, or 1 bowl of high-fiber cereal) per day is best. Too rapid an increase in fiber may result in constipation, flatulence, and bloating.   Drink enough water and fluids to keep your urine clear or pale yellow. Water, juice, or caffeine-free drinks are recommended. Not drinking enough fluid may cause constipation.   Eat a variety of high-fiber foods rather than one type of fiber.   Try to increase your intake of fiber through using high-fiber foods rather than fiber pills or supplements that contain small amounts of fiber.   The goal is to change the types of food eaten. Do not supplement your present diet with high-fiber foods, but replace foods in your present diet.   INCLUDE A VARIETY OF FIBER SOURCES  Replace refined and processed grains with whole grains, canned fruits with fresh fruits, and incorporate other fiber sources. White rice, white breads, and most bakery goods contain little or no fiber.   Brown whole-grain rice, buckwheat oats, and many fruits and vegetables are all good sources of fiber. These include: broccoli, Brussels sprouts, cabbage, cauliflower, beets, sweet potatoes, white potatoes (skin on), carrots, tomatoes, eggplant, squash, berries, fresh fruits, and dried fruits.   Cereals appear to be the richest source of fiber. Cereal fiber is found in whole grains and bran. Bran is the fiber-rich outer coat of cereal grain, which is largely removed in refining. In whole-grain cereals, the bran remains. In breakfast cereals, the largest amount of fiber is found in those with "bran" in their names. The fiber content is sometimes indicated on the label.   You may  need to include additional fruits and vegetables each day.   In baking, for 1 cup white flour, you may use the following substitutions:   1 cup whole-wheat flour minus 2 tablespoons.   1/2 cup white flour plus 1/2 cup whole-wheat flour.   Polyps, Colon  A polyp is extra tissue that grows inside your body. Colon polyps grow in the large intestine. The large intestine, also called the colon, is part of your digestive system. It is a long, hollow tube at the end of your digestive tract where your body makes and stores stool. Most polyps are not dangerous. They are benign. This means they are not cancerous. But over time, some types of polyps can turn into cancer. Polyps that are smaller than a pea are usually not harmful. But larger polyps could someday become or may already be cancerous. To be safe, doctors remove all polyps and test them.   WHO GETS POLYPS? Anyone can get polyps, but certain people are more likely than others. You may have a greater chance of getting  polyps if:  You are over 74.   You have had polyps before.   Someone in your family has had polyps.   Someone in your family has had cancer of the large intestine.   Find out if someone in your family has had polyps. You may also be more likely to get polyps if you:   Eat a lot of fatty foods   Smoke   Drink alcohol   Do not exercise  Eat too much   PREVENTION There is not one sure way to prevent polyps. You might be able to lower your risk of getting them if you:  Eat more fruits and vegetables and less fatty food.   Do not smoke.   Avoid alcohol.   Exercise every day.   Lose weight if you are overweight.   Eating more calcium and folate can also lower your risk of getting polyps. Some foods that are rich in calcium are milk, cheese, and broccoli. Some foods that are rich in folate are chickpeas, kidney beans, and spinach.   Hemorrhoids Hemorrhoids are dilated (enlarged) veins around the rectum. Sometimes  clots will form in the veins. Terie Purser

## 2018-04-09 NOTE — H&P (Signed)
Primary Care Physician:  Ginger Organ Primary Gastroenterologist:  Dr. Oneida Alar  Pre-Procedure History & Physical: HPI:  Stefanie Carlson is a 52 y.o. female here for [place].  Past Medical History:  Diagnosis Date  . Depression   . Thyroid disease     Past Surgical History:  Procedure Laterality Date  . ABDOMINAL HYSTERECTOMY    . CERVICAL SPINE SURGERY      Prior to Admission medications   Medication Sig Start Date End Date Taking? Authorizing Provider  escitalopram (LEXAPRO) 20 MG tablet Take 20 mg by mouth daily.   Yes [provider]  glimepiride (AMARYL) 4 MG tablet Take 4 mg by mouth daily with breakfast.   Yes [provider]  levothyroxine (SYNTHROID, LEVOTHROID) 112 MCG tablet Take 112 mcg by mouth daily.   Yes [provider]  lisinopril-hydrochlorothiazide (PRINZIDE,ZESTORETIC) 20-25 MG tablet Take 1 tablet by mouth daily.  01/19/18  Yes [provider]  rosuvastatin (CRESTOR) 10 MG tablet Take 10 mg by mouth daily.  01/19/18  Yes [provider]  saxagliptin HCl (ONGLYZA) 5 MG TABS tablet Take 5 mg by mouth every morning.   Yes [provider]    Allergies as of 03/08/2018  . (No Known Allergies)    Family History  Problem Relation Age of Onset  . Colon cancer Neg Hx   . Colon polyps Neg Hx     Social History   Socioeconomic History  . Marital status: Married    Spouse name: Not on file  . Number of children: Not on file  . Years of education: Not on file  . Highest education level: Not on file  Occupational History  . Not on file  Social Needs  . Financial resource strain: Not on file  . Food insecurity:    Worry: Not on file    Inability: Not on file  . Transportation needs:    Medical: Not on file    Non-medical: Not on file  Tobacco Use  . Smoking status: Never Smoker  . Smokeless tobacco: Never Used  Substance and Sexual Activity  . Alcohol use: No  . Drug use: No   . Sexual activity: Not on file  Lifestyle  . Physical activity:    Days per week: Not on file    Minutes per session: Not on file  . Stress: Not on file  Relationships  . Social connections:    Talks on phone: Not on file    Gets together: Not on file    Attends religious service: Not on file    Active member of club or organization: Not on file    Attends meetings of clubs or organizations: Not on file    Relationship status: Not on file  . Intimate partner violence:    Fear of current or ex partner: Not on file    Emotionally abused: Not on file    Physically abused: Not on file    Forced sexual activity: Not on file  Other Topics Concern  . Not on file  Social History Narrative  . Not on file    Review of Systems: See HPI, otherwise negative ROS   Physical Exam: BP (!) 144/63   Pulse 80   Temp 97.9 F (36.6 C) (Oral)   Resp 19   Ht 5\' 3"  (1.6 m)   Wt 198 lb 2 oz (89.9 kg)   BMI 35.10 kg/m  General:   Alert,  pleasant and cooperative  in NAD Head:  Normocephalic and atraumatic. Neck:  Supple; Lungs:  Clear throughout to auscultation.    Heart:  Regular rate and rhythm. Abdomen:  Soft, nontender and nondistended. Normal bowel sounds, without guarding, and without rebound.   Neurologic:  Alert and  oriented x4;  grossly normal neurologically.  Impression/Plan:    SCREENING  Plan:  1. TCS TODAY DISCUSSED PROCEDURE, BENEFITS, & RISKS: < 1% chance of medication reaction, bleeding, perforation, or rupture of spleen/liver.

## 2018-04-15 ENCOUNTER — Telehealth: Payer: Self-pay | Admitting: Gastroenterology

## 2018-04-15 ENCOUNTER — Encounter (HOSPITAL_COMMUNITY): Payer: Self-pay | Admitting: Gastroenterology

## 2018-04-15 NOTE — Telephone Encounter (Signed)
LMOM to call.

## 2018-04-15 NOTE — Telephone Encounter (Signed)
  Please call pt. She had TWO HYPERPLASTIC POLYPS removed.   DRINK WATER TO KEEP YOUR URINE LIGHT YELLOW.  CONTINUE YOUR WEIGHT LOSS EFFORTS. A WEIGHT OF 167 LBS OR LESS  WILL GET YOUR BODY MASS INDEX(BMI) UNDER 30.  FOLLOW A HIGH FIBER DIET. AVOID ITEMS THAT CAUSE BLOATING & GAS.    USE PREPARATION H FOUR TIMES  A DAY IF NEEDED TO RELIEVE RECTAL PAIN/PRESSURE/BLEEDING.  Next colonoscopy in 10 years.

## 2018-04-19 NOTE — Telephone Encounter (Signed)
REMINDER IN EPIC °

## 2018-04-19 NOTE — Telephone Encounter (Signed)
PT is aware.

## 2018-06-29 DIAGNOSIS — Z1231 Encounter for screening mammogram for malignant neoplasm of breast: Secondary | ICD-10-CM | POA: Diagnosis not present

## 2018-06-30 DIAGNOSIS — I10 Essential (primary) hypertension: Secondary | ICD-10-CM | POA: Diagnosis not present

## 2018-06-30 DIAGNOSIS — E039 Hypothyroidism, unspecified: Secondary | ICD-10-CM | POA: Diagnosis not present

## 2018-06-30 DIAGNOSIS — E1165 Type 2 diabetes mellitus with hyperglycemia: Secondary | ICD-10-CM | POA: Diagnosis not present

## 2018-10-05 DIAGNOSIS — J22 Unspecified acute lower respiratory infection: Secondary | ICD-10-CM | POA: Diagnosis not present

## 2018-10-05 DIAGNOSIS — J209 Acute bronchitis, unspecified: Secondary | ICD-10-CM | POA: Diagnosis not present

## 2018-10-05 DIAGNOSIS — E6609 Other obesity due to excess calories: Secondary | ICD-10-CM | POA: Diagnosis not present

## 2018-11-08 DIAGNOSIS — I10 Essential (primary) hypertension: Secondary | ICD-10-CM | POA: Diagnosis not present

## 2018-11-08 DIAGNOSIS — E7800 Pure hypercholesterolemia, unspecified: Secondary | ICD-10-CM | POA: Diagnosis not present

## 2018-11-08 DIAGNOSIS — E1165 Type 2 diabetes mellitus with hyperglycemia: Secondary | ICD-10-CM | POA: Diagnosis not present

## 2022-07-11 ENCOUNTER — Other Ambulatory Visit (HOSPITAL_COMMUNITY): Payer: Self-pay | Admitting: Family Medicine

## 2022-07-11 DIAGNOSIS — R0989 Other specified symptoms and signs involving the circulatory and respiratory systems: Secondary | ICD-10-CM

## 2022-07-21 ENCOUNTER — Ambulatory Visit (HOSPITAL_COMMUNITY): Payer: 59

## 2022-07-28 ENCOUNTER — Ambulatory Visit (HOSPITAL_COMMUNITY): Payer: 59

## 2022-09-09 ENCOUNTER — Ambulatory Visit (HOSPITAL_COMMUNITY)
Admission: RE | Admit: 2022-09-09 | Discharge: 2022-09-09 | Disposition: A | Discharge status: Home / Self Care | Payer: 59 | Source: Ambulatory Visit | Attending: Family Medicine | Admitting: Family Medicine

## 2022-09-09 DIAGNOSIS — R0989 Other specified symptoms and signs involving the circulatory and respiratory systems: Secondary | ICD-10-CM | POA: Diagnosis present

## 2023-09-08 ENCOUNTER — Other Ambulatory Visit (HOSPITAL_COMMUNITY): Payer: Self-pay | Admitting: Family Medicine

## 2023-09-08 DIAGNOSIS — R0989 Other specified symptoms and signs involving the circulatory and respiratory systems: Secondary | ICD-10-CM

## 2023-09-14 ENCOUNTER — Ambulatory Visit (HOSPITAL_COMMUNITY)
Admission: RE | Admit: 2023-09-14 | Discharge: 2023-09-14 | Disposition: A | Discharge status: Home / Self Care | Payer: No Typology Code available for payment source | Source: Ambulatory Visit | Attending: Family Medicine | Admitting: Family Medicine

## 2023-09-14 DIAGNOSIS — R0989 Other specified symptoms and signs involving the circulatory and respiratory systems: Secondary | ICD-10-CM | POA: Diagnosis present

## 2023-10-08 ENCOUNTER — Other Ambulatory Visit (HOSPITAL_COMMUNITY): Payer: Self-pay | Admitting: Family Medicine

## 2023-10-08 DIAGNOSIS — R221 Localized swelling, mass and lump, neck: Secondary | ICD-10-CM

## 2023-10-15 ENCOUNTER — Ambulatory Visit (HOSPITAL_COMMUNITY)
Admission: RE | Admit: 2023-10-15 | Discharge: 2023-10-15 | Disposition: A | Discharge status: Home / Self Care | Payer: No Typology Code available for payment source | Source: Ambulatory Visit | Attending: Family Medicine | Admitting: Family Medicine

## 2023-10-15 DIAGNOSIS — R221 Localized swelling, mass and lump, neck: Secondary | ICD-10-CM | POA: Insufficient documentation
